# Patient Record
Sex: Male | Born: 1990 | Race: Asian | Hispanic: No | Marital: Single | Smoking: Current some day smoker
Health system: Southern US, Community
[De-identification: ages and names within clinical notes are randomized; demographics above are authoritative.]

## PROBLEM LIST (undated history)

## (undated) DIAGNOSIS — Z5189 Encounter for other specified aftercare: Secondary | ICD-10-CM

## (undated) DIAGNOSIS — T7840XA Allergy, unspecified, initial encounter: Secondary | ICD-10-CM

## (undated) HISTORY — DX: Allergy, unspecified, initial encounter: T78.40XA

## (undated) HISTORY — DX: Encounter for other specified aftercare: Z51.89

## (undated) HISTORY — PX: FRACTURE SURGERY: SHX138

---

## 2015-03-04 ENCOUNTER — Ambulatory Visit (INDEPENDENT_AMBULATORY_CARE_PROVIDER_SITE_OTHER): Payer: 59 | Admitting: Family Medicine

## 2015-03-04 VITALS — BP 120/80 | HR 67 | Temp 98.7°F | Resp 20 | Ht 70.0 in | Wt 161.8 lb

## 2015-03-04 DIAGNOSIS — Z131 Encounter for screening for diabetes mellitus: Secondary | ICD-10-CM

## 2015-03-04 DIAGNOSIS — Z130 Encounter for screening for diseases of the blood and blood-forming organs and certain disorders involving the immune mechanism: Secondary | ICD-10-CM

## 2015-03-04 DIAGNOSIS — Z0000 Encounter for general adult medical examination without abnormal findings: Secondary | ICD-10-CM | POA: Diagnosis not present

## 2015-03-04 DIAGNOSIS — Z13220 Encounter for screening for lipoid disorders: Secondary | ICD-10-CM | POA: Diagnosis not present

## 2015-03-04 NOTE — Progress Notes (Signed)
[facility] [address], [place] [id number] [phone]  Date:  03/04/2015   Name:  Derek Moran   DOB:  [birth year]   MRN:  [id number]  PCP:  No primary care provider on file.    Chief Complaint: Annual Exam   History of Present Illness:  Derek Moran is a 24 y.o. very pleasant male patient who presents with the following:  He is here today seeking a CPE and biometric screening for his job.  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   [place] and this is the first time he has seen a doctor here.   As a child he needed an operation for an ankle fracture His shots are UTD from when he came to [place] He declines a flu shot this year.   He last ate around 7.5 hours ago  There are no active problems to display for this patient.   Past Medical History  Diagnosis Date  . Allergy   . Blood transfusion without reported diagnosis     Past Surgical History  Procedure Laterality Date  . Fracture surgery      Social History  Substance Use Topics  . Smoking status: Current Every Day Smoker -- 0.20 packs/day    Types: Cigarettes  . Smokeless tobacco: None  . Alcohol Use: 0.6 oz/week    1 Standard drinks or equivalent per week    History reviewed. No pertinent family history.  No Known Allergies  Medication list has been reviewed and updated.  No current outpatient prescriptions on file prior to visit.   No current facility-administered medications on file prior to visit.    Review of Systems:  As per HPI- otherwise negative.   Physical Examination: Filed Vitals:   03/04/15 1825  BP: 120/80  Pulse: 67  Temp: 98.7 F (37.1 C)  Resp: 20   Filed Vitals:   03/04/15 1825  Height: 5\' 10"  (1.778 m)  Weight: 161 lb 12.8 oz (73.392 kg)   Body mass index is 23.22 kg/(m^2). Ideal Body Weight: Weight in (lb) to have BMI = 25: 173.9  GEN: WDWN, NAD, Non-toxic, A & O x 3, looks well HEENT:  Atraumatic, Normocephalic. Neck supple. No masses, No LAD.  Bilateral TM wnl, oropharynx normal.  PEERL,EOMI.   Ears and Nose: No external deformity. CV: RRR, No M/G/R. No JVD. No thrill. No extra heart sounds. PULM: CTA B, no wheezes, crackles, rhonchi. No retractions. No resp. distress. No accessory muscle use. ABD: S, NT, ND EXTR: No c/c/e NEURO Normal gait.  PSYCH: Normally interactive. Conversant. Not depressed or anxious appearing.  Calm demeanor.    Assessment and Plan: Physical exam  Screening for hyperlipidemia - Plan: Lipid panel  Screening for diabetes mellitus - Plan: Comprehensive metabolic panel  Screening for deficiency anemia - Plan: CBC  CPE today- await labs and will fax in his forms for him He does not have any other concerns today Declines a flu shot today  Signed Abbe AmsterdamJessica Vyolet Sakuma, MD

## 2015-03-04 NOTE — Patient Instructions (Signed)
It was good to see you today!  We will complete and fax in your forms when your labs come in- I will also send you a copy of your labs in the mail

## 2015-03-05 ENCOUNTER — Encounter: Payer: Self-pay | Admitting: Family Medicine

## 2015-03-05 LAB — CBC
HCT: 44.9 % (ref 39.0–52.0)
Hemoglobin: 15.5 g/dL (ref 13.0–17.0)
MCH: 28.3 pg (ref 26.0–34.0)
MCHC: 34.5 g/dL (ref 30.0–36.0)
MCV: 82.1 fL (ref 78.0–100.0)
MPV: 10.3 fL (ref 8.6–12.4)
PLATELETS: 269 10*3/uL (ref 150–400)
RBC: 5.47 MIL/uL (ref 4.22–5.81)
RDW: 13.5 % (ref 11.5–15.5)
WBC: 7.4 10*3/uL (ref 4.0–10.5)

## 2015-03-05 LAB — COMPREHENSIVE METABOLIC PANEL
ALK PHOS: 54 U/L (ref 40–115)
ALT: 29 U/L (ref 9–46)
AST: 18 U/L (ref 10–40)
Albumin: 4.7 g/dL (ref 3.6–5.1)
BUN: 7 mg/dL (ref 7–25)
CO2: 26 mmol/L (ref 20–31)
Calcium: 9.4 mg/dL (ref 8.6–10.3)
Chloride: 101 mmol/L (ref 98–110)
Creat: 0.89 mg/dL (ref 0.60–1.35)
GLUCOSE: 81 mg/dL (ref 65–99)
POTASSIUM: 4.1 mmol/L (ref 3.5–5.3)
SODIUM: 137 mmol/L (ref 135–146)
Total Bilirubin: 0.7 mg/dL (ref 0.2–1.2)
Total Protein: 7.3 g/dL (ref 6.1–8.1)

## 2015-03-05 LAB — LIPID PANEL
Cholesterol: 193 mg/dL (ref 125–200)
HDL: 34 mg/dL — ABNORMAL LOW (ref >=40)
LDL CALC: 129 mg/dL (ref <130)
Total CHOL/HDL Ratio: 5.7 Ratio — ABNORMAL HIGH (ref <=5.0)
Triglycerides: 150 mg/dL — ABNORMAL HIGH (ref <150)
VLDL: 30 mg/dL (ref <30)

## 2015-05-05 ENCOUNTER — Ambulatory Visit (INDEPENDENT_AMBULATORY_CARE_PROVIDER_SITE_OTHER): Payer: 59 | Admitting: Physician Assistant

## 2015-05-05 ENCOUNTER — Ambulatory Visit (INDEPENDENT_AMBULATORY_CARE_PROVIDER_SITE_OTHER): Payer: 59

## 2015-05-05 VITALS — BP 118/76 | HR 62 | Temp 98.2°F | Resp 18 | Ht 70.25 in | Wt 164.2 lb

## 2015-05-05 DIAGNOSIS — R079 Chest pain, unspecified: Secondary | ICD-10-CM

## 2015-05-05 LAB — COMPLETE METABOLIC PANEL WITH GFR
ALT: 45 U/L (ref 9–46)
AST: 26 U/L (ref 10–40)
Albumin: 4.7 g/dL (ref 3.6–5.1)
Alkaline Phosphatase: 62 U/L (ref 40–115)
BUN: 8 mg/dL (ref 7–25)
CALCIUM: 10.2 mg/dL (ref 8.6–10.3)
CHLORIDE: 102 mmol/L (ref 98–110)
CO2: 28 mmol/L (ref 20–31)
CREATININE: 0.89 mg/dL (ref 0.60–1.35)
GFR, Est African American: >89 mL/min (ref >=60)
GFR, Est Non African American: >89 mL/min (ref >=60)
Glucose, Bld: 88 mg/dL (ref 65–99)
POTASSIUM: 4.6 mmol/L (ref 3.5–5.3)
SODIUM: 138 mmol/L (ref 135–146)
Total Bilirubin: 0.7 mg/dL (ref 0.2–1.2)
Total Protein: 7.8 g/dL (ref 6.1–8.1)

## 2015-05-05 LAB — POCT CBC
GRANULOCYTE PERCENT: 53 % (ref 37–80)
HEMATOCRIT: 46.2 % (ref 43.5–53.7)
HEMOGLOBIN: 16.3 g/dL (ref 14.1–18.1)
Lymph, poc: 2.7 (ref 0.6–3.4)
MCH: 29.8 pg (ref 27–31.2)
MCHC: 35.3 g/dL (ref 31.8–35.4)
MCV: 84.3 fL (ref 80–97)
MID (cbc): 0.5 (ref 0–0.9)
MPV: 7.7 fL (ref 0–99.8)
POC GRANULOCYTE: 3.6 (ref 2–6.9)
POC LYMPH PERCENT: 40.1 %L (ref 10–50)
POC MID %: 6.9 % (ref 0–12)
Platelet Count, POC: 217 10*3/uL (ref 142–424)
RBC: 5.48 M/uL (ref 4.69–6.13)
RDW, POC: 13.7 %
WBC: 6.8 10*3/uL (ref 4.6–10.2)

## 2015-05-05 LAB — TROPONIN I

## 2015-05-05 MED ORDER — IBUPROFEN 600 MG PO TABS: 600.0000 mg | ORAL_TABLET | Freq: Four times a day (QID) | ORAL | Status: DC | PRN

## 2015-05-05 NOTE — Progress Notes (Signed)
05/05/2015 2:27 PM   DOB: 19-Apr-1990 / MRN: [id number]  SUBJECTIVE:  Derek Moran is a 25 y.o. male presenting for chest pain that started four days ago.  Reports the pain feels like a "pulled muscle" on the left side of his chest, however he can not reproduce this.  States the pain is coming and going and does not occur with any physical activity.  Denies any recent GERD like symptoms, cough, chills, nausea, diaphoresis with the pain.  No family history of CAD.  He quit smoking roughly 3 years ago and has less than 1 pack year history.  He has not tried anything for this problem yet.  No recent illness.    He has No Known Allergies.   He  has a past medical history of Allergy and Blood transfusion without reported diagnosis.    He  reports that he has quit smoking. His smoking use included Cigarettes. He smoked 0.20 packs per day. He does not have any smokeless tobacco history on file. He reports that he drinks about 0.6 oz of alcohol per week. He reports that he does not use illicit drugs. He  has no sexual activity history on file. The patient  has past surgical history that includes Fracture surgery.  His family history is not on file.  Review of Systems  Constitutional: Negative for fever and chills.  Eyes: Negative for blurred vision.  Respiratory: Negative for cough and shortness of breath.   Cardiovascular: Negative for chest pain.  Gastrointestinal: Negative for nausea and abdominal pain.  Genitourinary: Negative for dysuria, urgency and frequency.  Musculoskeletal: Negative for myalgias.  Skin: Negative for rash.  Neurological: Negative for dizziness, tingling and headaches.  Psychiatric/Behavioral: Negative for depression. The patient is not nervous/anxious.     Problem list and medications reviewed and updated by myself where necessary, and exist elsewhere in the encounter.   OBJECTIVE:  BP 118/76 mmHg  Pulse 62  Temp(Src) 98.2 F (36.8 C) (Oral)  Resp 18  Ht 5'  10.25" (1.784 m)  Wt 164 lb 3.2 oz (74.481 kg)  BMI 23.40 kg/m2  SpO2 99%  Physical Exam  Constitutional: He is oriented to person, place, and time. He appears well-developed. He does not appear ill.  Eyes: Conjunctivae and EOM are normal. Pupils are equal, round, and reactive to light.  Cardiovascular: Normal rate, regular rhythm, S1 normal and S2 normal.  Exam reveals no gallop.   Pulmonary/Chest: Effort normal. He has no decreased breath sounds. He has no wheezes. He has no rhonchi. He has no rales. He exhibits no tenderness, no bony tenderness and no deformity. Right breast exhibits no skin change. Left breast exhibits no skin change.  Abdominal: He exhibits no distension.  Musculoskeletal: Normal range of motion.  Neurological: He is alert and oriented to person, place, and time. No cranial nerve deficit. Coordination normal.  Skin: Skin is warm and dry. He is not diaphoretic.  Psychiatric: He has a normal mood and affect.  Nursing note and vitals reviewed.   Results for orders placed or performed in visit on 05/05/15 (from the past 72 hour(s))  POCT CBC     Status: None   Collection Time: 05/05/15  2:22 PM  Result Value Ref Range   WBC 6.8 4.6 - 10.2 K/uL   Lymph, poc 2.7 0.6 - 3.4   POC LYMPH PERCENT 40.1 10 - 50 %L   MID (cbc) 0.5 0 - 0.9   POC MID % 6.9 0 - 12 %M  POC Granulocyte 3.6 2 - 6.9   Granulocyte percent 53.0 37 - 80 %G   RBC 5.48 4.69 - 6.13 M/uL   Hemoglobin 16.3 14.1 - 18.1 g/dL   HCT, POC 16.1 09.6 - 53.7 %   MCV 84.3 80 - 97 fL   MCH, POC 29.8 27 - 31.2 pg   MCHC 35.3 31.8 - 35.4 g/dL   RDW, POC 04.5 %   Platelet Count, POC 217 142 - 424 K/uL   MPV 7.7 0 - 99.8 fL    Dg Chest 2 View  05/05/2015  CLINICAL DATA:  Chest pain. EXAM: CHEST  2 VIEW COMPARISON:  No prior. FINDINGS: Mediastinum and hilar structures normal. Lungs are clear. No pleural effusion or pneumothorax. No acute bony abnormality. Mild scoliosis thoracic spine concave left . IMPRESSION:  No acute cardiopulmonary disease. Electronically Signed   By: Maisie Fus  Register   On: 05/05/2015 14:05    ASSESSMENT AND PLAN  Derek Moran was seen today for chest pain.  Diagnoses and all orders for this visit:  Chest pain, unspecified chest pain type: Telephone [phone]. His pain is sporadic and is not consistent with ischemia, however will rule that out. His has no pleuritic chest pain, is a non smoker, and denies leg swelling and pain today.  Will hold on d-dimer.   Given the palpitations his is describing in HPI will get him a consult with Dr. Anselm Jungling for further work up of that.  I do think his pain is MSK in nature and have provided him with 600 mg Ibuprofen.,   -     EKG 12-Lead -     DG Chest 2 View; Future -     POCT CBC -     COMPLETE METABOLIC PANEL WITH GFR -     Troponin I    The patient was advised to call or return to clinic if he does not see an improvement in symptoms or to seek the care of the closest emergency department if he worsens with the above plan.   Deliah Boston, MHS, PA-C [facility] 05/05/2015 2:27 PM

## 2015-05-05 NOTE — Patient Instructions (Signed)
Because you received an x-ray today, you will receive an invoice from [facility]. Please contact [facility] at [phone] with questions or concerns regarding your invoice. Our billing staff will not be able to assist you with those questions. °

## 2015-11-21 ENCOUNTER — Ambulatory Visit (INDEPENDENT_AMBULATORY_CARE_PROVIDER_SITE_OTHER): Payer: 59 | Admitting: Physician Assistant

## 2015-11-21 VITALS — BP 102/60 | HR 64 | Temp 98.6°F | Resp 18 | Ht 70.25 in | Wt 161.0 lb

## 2015-11-21 DIAGNOSIS — L299 Pruritus, unspecified: Secondary | ICD-10-CM

## 2015-11-21 DIAGNOSIS — R21 Rash and other nonspecific skin eruption: Secondary | ICD-10-CM

## 2015-11-21 DIAGNOSIS — J45909 Unspecified asthma, uncomplicated: Secondary | ICD-10-CM | POA: Insufficient documentation

## 2015-11-21 MED ORDER — HYDROXYZINE HCL 25 MG PO TABS: 12.5000 mg | ORAL_TABLET | Freq: Three times a day (TID) | ORAL | 0 refills | Status: AC | PRN

## 2015-11-21 MED ORDER — RANITIDINE HCL 150 MG PO TABS: 150.0000 mg | ORAL_TABLET | Freq: Once | ORAL | 1 refills | Status: AC

## 2015-11-21 NOTE — Progress Notes (Signed)
   Derek Moran  MRN: [id number] DOB: September 06, 1990  Subjective:  Derek Moran is a 25 y.o. male seen in office today for a chief complaint of itching x 1 day.  The itching started yesterday after eating lunch. Had associated bumps on arms, chest, neck, and back.Pt cannot recall changes in soap, laundry detergent, or lotions. Denies exposure to plants, new foods, new medications, insects or ticks. He has tried benedryl with relief. Only a few lesions are present with less severity in itching.  Pt notes that itching yesterday was 8/10. Today it is a 2/10.   Review of Systems  Constitutional: Positive for fever (felt hot yesterday). Negative for chills, diaphoresis and fatigue.  HENT: Negative for congestion, sinus pressure and trouble swallowing.   Respiratory: Negative for chest tightness, shortness of breath and wheezing.   Cardiovascular: Negative for chest pain and palpitations.  Gastrointestinal: Negative for abdominal pain.    Patient Active Problem List   Diagnosis Date Noted  . Asthma 11/21/2015    No current outpatient prescriptions on file prior to visit.   No current facility-administered medications on file prior to visit.     No Known Allergies  Objective:  BP 102/60 (BP Location: Right Arm, Patient Position: Sitting, Cuff Size: Small)   Pulse 64   Temp 98.6 F (37 C) (Oral)   Resp 18   Ht 5' 10.25" (1.784 m)   Wt 161 lb (73 kg)   SpO2 100%   BMI 22.94 kg/m   Physical Exam  Constitutional: He is oriented to person, place, and time and well-developed, well-nourished, and in no distress.  HENT:  Head: Normocephalic and atraumatic.  Eyes: Conjunctivae are normal.  Neck: Normal range of motion.  Pulmonary/Chest: Effort normal.  Neurological: He is alert and oriented to person, place, and time. Gait normal.  Skin: Skin is warm and dry. Rash noted. Rash is urticarial.     Psychiatric: Affect normal.    Assessment and Plan :   1. Itching 2. Rash and  nonspecific skin eruption -Consistent with urticaria  - hydrOXYzine (ATARAX/VISTARIL) 25 MG tablet; Take 0.5-1 tablets (12.5-25 mg total) by mouth every 8 (eight) hours as needed for itching.  Dispense: 30 tablet; Refill: 0 - ranitidine (ZANTAC) 150 MG tablet; Take 1 tablet (150 mg total) by mouth once.  Dispense: 30 tablet; Refill: 1 -Return to clinic if symptoms worsen, do not improve, or as needed  Benjiman CoreBrittany Wiseman PA-C  [facility] 11/23/2015 12:17 AM

## 2015-11-21 NOTE — Patient Instructions (Addendum)
    Hives Hives are itchy, red, swollen areas of the skin. They can vary in size and location on your body. Hives can come and go for hours or several days (acute hives) or for several weeks (chronic hives). Hives do not spread from person to person (noncontagious). They may get worse with scratching, exercise, and emotional stress. CAUSES   Allergic reaction to food, additives, or drugs.  Infections, including the common cold.  Illness, such as vasculitis, lupus, or thyroid disease.  Exposure to sunlight, heat, or cold.  Exercise.  Stress.  Contact with chemicals. SYMPTOMS   Red or white swollen patches on the skin. The patches may change size, shape, and location quickly and repeatedly.  Itching.  Swelling of the hands, feet, and face. This may occur if hives develop deeper in the skin. DIAGNOSIS  Your caregiver can usually tell what is wrong by performing a physical exam. Skin or blood tests may also be done to determine the cause of your hives. In some cases, the cause cannot be determined. TREATMENT  Mild cases usually get better with medicines such as antihistamines. Severe cases may require an emergency epinephrine injection. If the cause of your hives is known, treatment includes avoiding that trigger.  HOME CARE INSTRUCTIONS   Avoid causes that trigger your hives.  Take antihistamines as directed by your caregiver to reduce the severity of your hives. Non-sedating or low-sedating antihistamines are usually recommended. Do not drive while taking an antihistamine.  Take any other medicines prescribed for itching as directed by your caregiver.  Wear loose-fitting clothing.  Keep all follow-up appointments as directed by your caregiver. SEEK MEDICAL CARE IF:   You have persistent or severe itching that is not relieved with medicine.  You have painful or swollen joints. SEEK IMMEDIATE MEDICAL CARE IF:   You have a fever.  Your tongue or lips are swollen.  You  have trouble breathing or swallowing.  You feel tightness in the throat or chest.  You have abdominal pain. These problems may be the first sign of a life-threatening allergic reaction. Call your local emergency services (911 in [place] MAKE SURE YOU:   Understand these instructions.  Will watch your condition.  Will get help right away if you are not doing well or get worse.   This information is not intended to replace advice given to you by your health care provider. Make sure you discuss any questions you have with your health care provider.   Document Released: 02/22/2005 Document Revised: 02/27/2013 Document Reviewed: 05/18/2011 Elsevier Interactive Patient Education [employer]2016 [employer]   IF you received an x-ray today, you will receive an invoice from [facility]. Please contact [facility] at [phone] with questions or concerns regarding your invoice.   IF you received labwork today, you will receive an invoice from [facility]. Please contact [facility] at [phone] with questions or concerns regarding your invoice.   Our billing staff will not be able to assist you with questions regarding bills from these companies.  You will be contacted with the lab results as soon as they are available. The fastest way to get your results is to activate your My Chart account. Instructions are located on the last page of this paperwork. If you have not heard from us regarding the results in 2 weeks, please contact this office.

## 2017-01-09 IMAGING — CR DG CHEST 2V
2 series · 2 of 2 positions shown · non-contrast
Comparison: No prior.

CLINICAL DATA: Chest pain.

EXAM:
CHEST  2 VIEW

[PA]
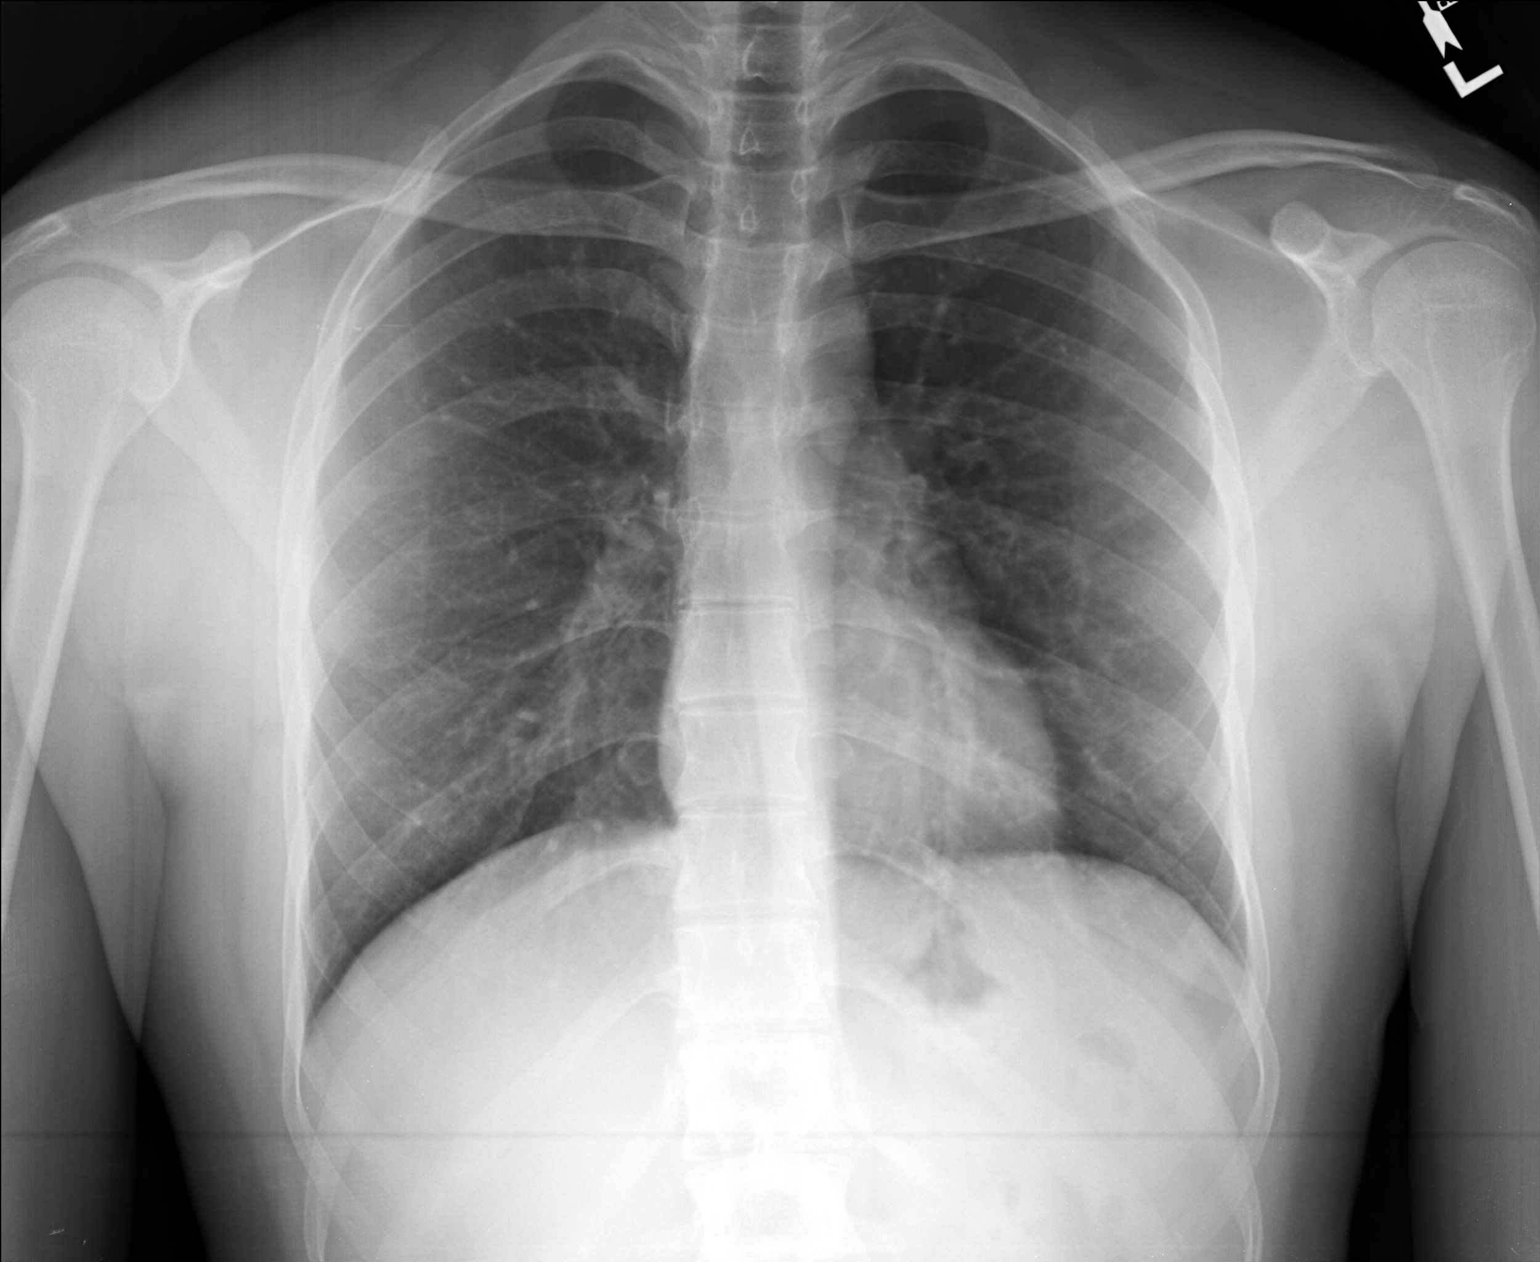

[lateral]
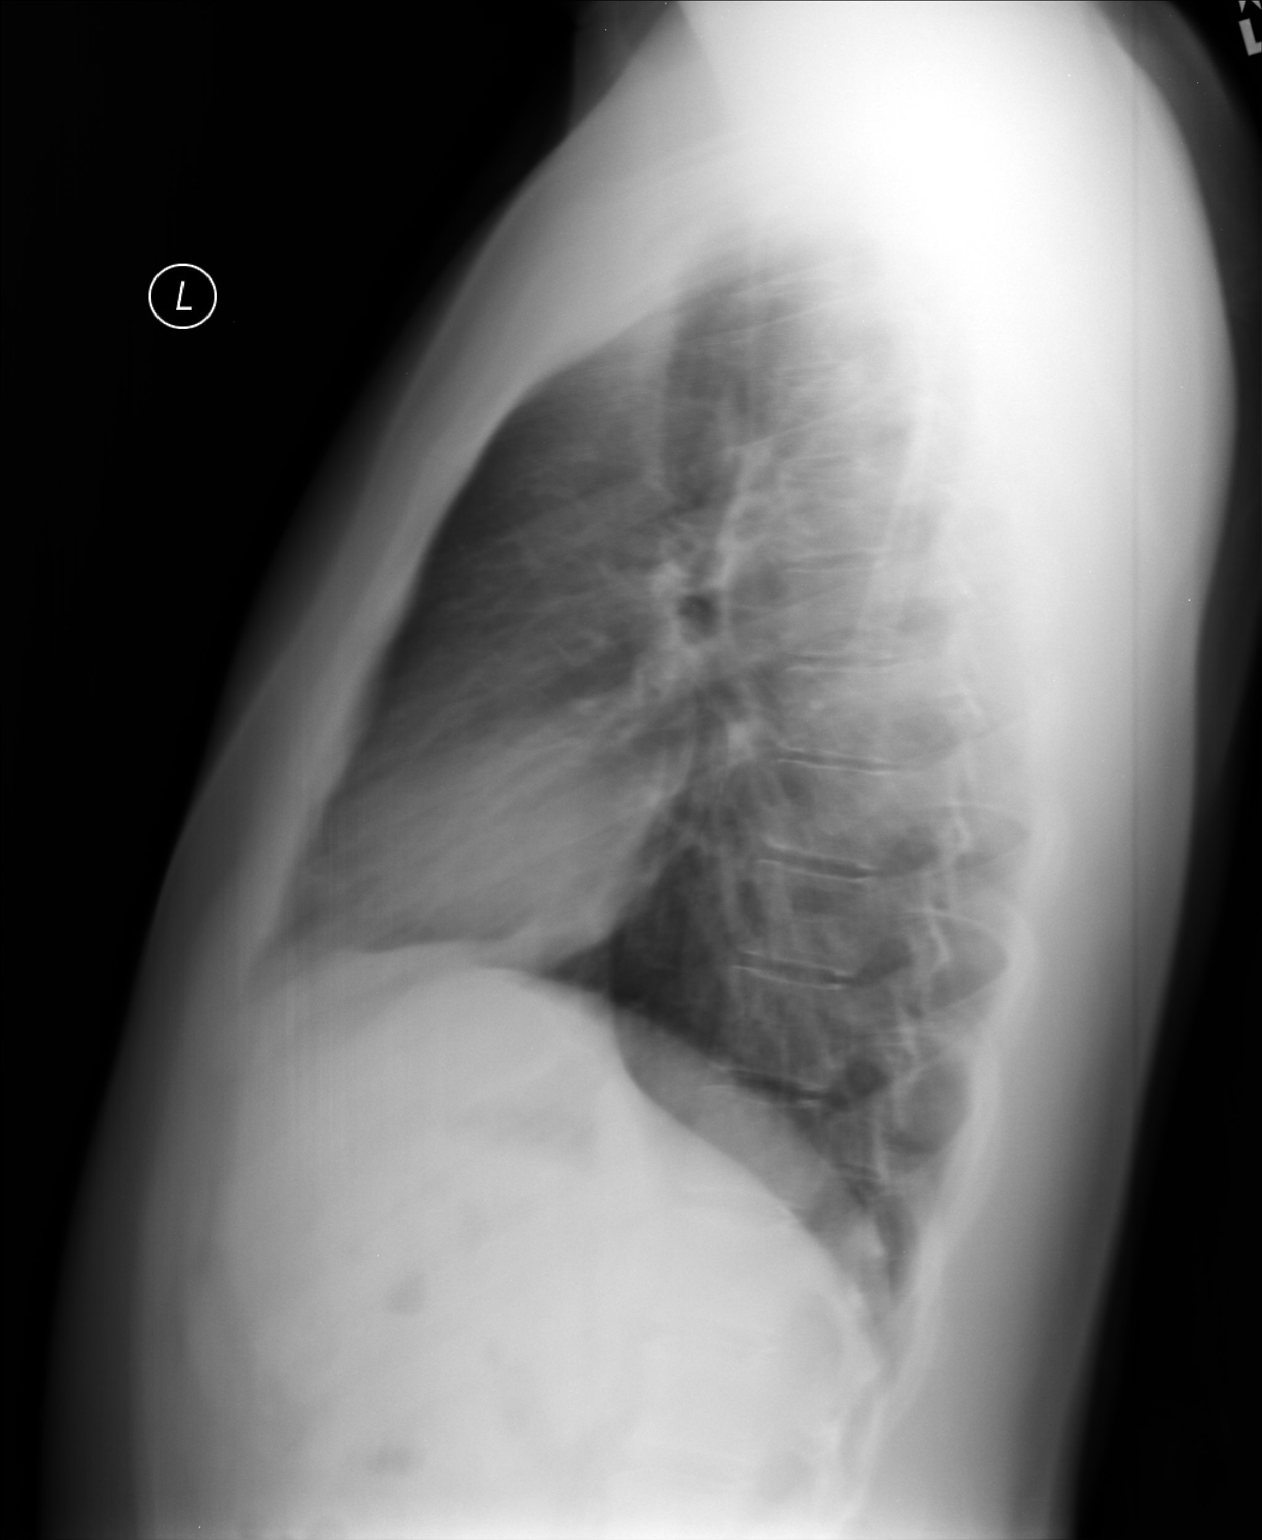

[2 of 2 positions shown; findings below may reference images not displayed]

FINDINGS: Mediastinum and hilar structures normal. Lungs are clear. No pleural
effusion or pneumothorax. No acute bony abnormality. Mild scoliosis
thoracic spine concave left .
IMPRESSION: No acute cardiopulmonary disease.
